# Patient Record
Sex: Male | Born: 1950 | Race: Black or African American | Hispanic: No | Smoking: Current some day smoker
Health system: Southern US, Community
[De-identification: ages and names within clinical notes are randomized; demographics above are authoritative.]

## PROBLEM LIST (undated history)

## (undated) DIAGNOSIS — I10 Essential (primary) hypertension: Secondary | ICD-10-CM

## (undated) DIAGNOSIS — C801 Malignant (primary) neoplasm, unspecified: Secondary | ICD-10-CM

## (undated) DIAGNOSIS — B1920 Unspecified viral hepatitis C without hepatic coma: Secondary | ICD-10-CM

## (undated) HISTORY — PX: COLON SURGERY: SHX602

## (undated) HISTORY — PX: APPENDECTOMY: SHX54

## (undated) HISTORY — PX: ABDOMINAL SURGERY: SHX537

---

## 2005-05-30 ENCOUNTER — Ambulatory Visit: Payer: Self-pay | Admitting: Gastroenterology

## 2010-05-02 ENCOUNTER — Ambulatory Visit: Payer: Self-pay | Admitting: Oncology

## 2010-05-03 ENCOUNTER — Inpatient Hospital Stay: Payer: Self-pay | Admitting: General Surgery

## 2010-05-31 ENCOUNTER — Ambulatory Visit: Payer: Self-pay | Admitting: Oncology

## 2010-07-02 ENCOUNTER — Ambulatory Visit: Payer: Self-pay | Admitting: Oncology

## 2010-07-04 ENCOUNTER — Ambulatory Visit: Payer: Self-pay | Admitting: Oncology

## 2010-08-02 ENCOUNTER — Ambulatory Visit: Payer: Self-pay | Admitting: Oncology

## 2010-09-01 ENCOUNTER — Ambulatory Visit: Payer: Self-pay | Admitting: Oncology

## 2010-10-02 ENCOUNTER — Ambulatory Visit: Payer: Self-pay | Admitting: Oncology

## 2010-10-02 ENCOUNTER — Ambulatory Visit: Payer: Self-pay

## 2010-11-01 ENCOUNTER — Ambulatory Visit: Payer: Self-pay | Admitting: Oncology

## 2010-12-02 ENCOUNTER — Ambulatory Visit: Payer: Self-pay | Admitting: Oncology

## 2011-01-02 ENCOUNTER — Ambulatory Visit: Payer: Self-pay | Admitting: Oncology

## 2011-01-31 ENCOUNTER — Ambulatory Visit: Payer: Self-pay | Admitting: Oncology

## 2011-03-03 ENCOUNTER — Ambulatory Visit: Payer: Self-pay | Admitting: Oncology

## 2011-04-02 ENCOUNTER — Ambulatory Visit: Payer: Self-pay | Admitting: Oncology

## 2011-05-03 ENCOUNTER — Ambulatory Visit: Payer: Self-pay | Admitting: Oncology

## 2011-05-06 ENCOUNTER — Ambulatory Visit: Payer: Self-pay | Admitting: Oncology

## 2011-05-13 ENCOUNTER — Ambulatory Visit: Payer: Self-pay | Admitting: Oncology

## 2011-06-02 ENCOUNTER — Ambulatory Visit: Payer: Self-pay | Admitting: Oncology

## 2011-07-06 ENCOUNTER — Emergency Department: Payer: Self-pay | Admitting: Emergency Medicine

## 2011-07-07 ENCOUNTER — Ambulatory Visit: Payer: Self-pay | Admitting: Oncology

## 2011-08-14 ENCOUNTER — Ambulatory Visit: Payer: Self-pay | Admitting: Oncology

## 2011-09-02 ENCOUNTER — Ambulatory Visit: Payer: Self-pay | Admitting: Oncology

## 2011-10-03 ENCOUNTER — Ambulatory Visit: Payer: Self-pay | Admitting: Oncology

## 2011-11-02 ENCOUNTER — Ambulatory Visit: Payer: Self-pay | Admitting: Oncology

## 2011-12-03 ENCOUNTER — Ambulatory Visit: Payer: Self-pay | Admitting: Oncology

## 2012-01-03 ENCOUNTER — Ambulatory Visit: Payer: Self-pay | Admitting: Oncology

## 2012-02-03 ENCOUNTER — Ambulatory Visit: Payer: Self-pay | Admitting: Oncology

## 2012-03-02 ENCOUNTER — Ambulatory Visit: Payer: Self-pay | Admitting: Oncology

## 2012-03-24 ENCOUNTER — Ambulatory Visit: Payer: Self-pay | Admitting: Oncology

## 2012-03-26 ENCOUNTER — Ambulatory Visit: Payer: Self-pay | Admitting: Oncology

## 2012-04-01 ENCOUNTER — Ambulatory Visit: Payer: Self-pay | Admitting: Oncology

## 2012-04-30 DIAGNOSIS — M549 Dorsalgia, unspecified: Secondary | ICD-10-CM | POA: Insufficient documentation

## 2012-04-30 DIAGNOSIS — C19 Malignant neoplasm of rectosigmoid junction: Secondary | ICD-10-CM | POA: Insufficient documentation

## 2012-05-02 ENCOUNTER — Ambulatory Visit: Payer: Self-pay | Admitting: Oncology

## 2012-06-01 ENCOUNTER — Ambulatory Visit: Payer: Self-pay | Admitting: Oncology

## 2012-06-18 LAB — CBC CANCER CENTER
Basophil #: 0 x10 3/mm (ref 0.0–0.1)
Eosinophil #: 0.1 x10 3/mm (ref 0.0–0.7)
HGB: 13.5 g/dL (ref 13.0–18.0)
Lymphocyte %: 37.6 %
MCH: 32.3 pg (ref 26.0–34.0)
MCV: 100 fL (ref 80–100)
Monocyte %: 8.8 %
Neutrophil %: 51.4 %
RBC: 4.18 10*6/uL — ABNORMAL LOW (ref 4.40–5.90)
RDW: 12.1 % (ref 11.5–14.5)
WBC: 5.8 x10 3/mm (ref 3.8–10.6)

## 2012-07-02 ENCOUNTER — Ambulatory Visit: Payer: Self-pay | Admitting: Oncology

## 2012-12-02 ENCOUNTER — Ambulatory Visit: Payer: Self-pay | Admitting: Oncology

## 2012-12-17 LAB — CBC CANCER CENTER
Basophil #: 0.1 x10 3/mm (ref 0.0–0.1)
Basophil %: 0.8 %
Eosinophil %: 1.4 %
HCT: 43.1 % (ref 40.0–52.0)
Lymphocyte #: 2.4 x10 3/mm (ref 1.0–3.6)
Lymphocyte %: 33.2 %
MCH: 33 pg (ref 26.0–34.0)
MCV: 97 fL (ref 80–100)
Monocyte #: 0.6 x10 3/mm (ref 0.2–1.0)
Monocyte %: 8.3 %
Neutrophil #: 4 x10 3/mm (ref 1.4–6.5)
Neutrophil %: 56.3 %
Platelet: 73 x10 3/mm — ABNORMAL LOW (ref 150–440)
RBC: 4.43 10*6/uL (ref 4.40–5.90)
RDW: 12.7 % (ref 11.5–14.5)
WBC: 7.1 x10 3/mm (ref 3.8–10.6)

## 2013-01-02 ENCOUNTER — Ambulatory Visit: Payer: Self-pay | Admitting: Oncology

## 2013-03-26 ENCOUNTER — Ambulatory Visit: Payer: Self-pay | Admitting: Oncology

## 2013-04-06 ENCOUNTER — Ambulatory Visit: Payer: Self-pay | Admitting: Oncology

## 2013-04-08 ENCOUNTER — Ambulatory Visit: Payer: Self-pay | Admitting: Oncology

## 2013-05-02 ENCOUNTER — Ambulatory Visit: Payer: Self-pay | Admitting: Oncology

## 2013-06-02 ENCOUNTER — Ambulatory Visit: Payer: Self-pay | Admitting: Family Medicine

## 2013-07-02 ENCOUNTER — Ambulatory Visit: Payer: Self-pay | Admitting: Oncology

## 2013-08-30 ENCOUNTER — Ambulatory Visit: Payer: Self-pay | Admitting: Gastroenterology

## 2013-08-30 LAB — CBC WITH DIFFERENTIAL/PLATELET
Basophil %: 1.1 %
Eosinophil #: 0.1 10*3/uL (ref 0.0–0.7)
Eosinophil %: 2.4 %
Lymphocyte #: 2.1 10*3/uL (ref 1.0–3.6)
MCH: 33.5 pg (ref 26.0–34.0)
MCHC: 33.4 g/dL (ref 32.0–36.0)
MCV: 100 fL (ref 80–100)
Monocyte #: 0.4 x10 3/mm (ref 0.2–1.0)
Monocyte %: 8.6 %
Neutrophil #: 2 10*3/uL (ref 1.4–6.5)
Platelet: 62 10*3/uL — ABNORMAL LOW (ref 150–440)
WBC: 4.6 10*3/uL (ref 3.8–10.6)

## 2013-10-08 ENCOUNTER — Ambulatory Visit: Payer: Self-pay | Admitting: Oncology

## 2013-10-08 LAB — CBC CANCER CENTER
Basophil #: 0.1 x10 3/mm (ref 0.0–0.1)
Eosinophil %: 1.4 %
HGB: 14.6 g/dL (ref 13.0–18.0)
Lymphocyte #: 2.4 x10 3/mm (ref 1.0–3.6)
Lymphocyte %: 37 %
MCHC: 32.9 g/dL (ref 32.0–36.0)
Monocyte #: 0.7 x10 3/mm (ref 0.2–1.0)
Monocyte %: 10.5 %
Neutrophil %: 50.3 %
Platelet: 84 x10 3/mm — ABNORMAL LOW (ref 150–440)
RBC: 4.44 10*6/uL (ref 4.40–5.90)
RDW: 13 % (ref 11.5–14.5)

## 2013-10-08 LAB — BASIC METABOLIC PANEL
BUN: 7 mg/dL (ref 7–18)
Calcium, Total: 9.3 mg/dL (ref 8.5–10.1)
Chloride: 103 mmol/L (ref 98–107)
Co2: 31 mmol/L (ref 21–32)
Creatinine: 1.04 mg/dL (ref 0.60–1.30)
EGFR (African American): >60
Glucose: 96 mg/dL (ref 65–99)
Osmolality: 279 (ref 275–301)
Potassium: 4 mmol/L (ref 3.5–5.1)
Sodium: 141 mmol/L (ref 136–145)

## 2013-11-01 ENCOUNTER — Ambulatory Visit: Payer: Self-pay | Admitting: Oncology

## 2014-04-21 ENCOUNTER — Ambulatory Visit: Payer: Self-pay | Admitting: Oncology

## 2015-10-03 ENCOUNTER — Other Ambulatory Visit: Payer: Self-pay | Admitting: Nurse Practitioner

## 2015-10-03 DIAGNOSIS — B182 Chronic viral hepatitis C: Secondary | ICD-10-CM

## 2015-10-04 ENCOUNTER — Other Ambulatory Visit: Payer: Self-pay | Admitting: Nurse Practitioner

## 2015-10-04 DIAGNOSIS — B182 Chronic viral hepatitis C: Secondary | ICD-10-CM

## 2015-10-11 ENCOUNTER — Ambulatory Visit
Admission: RE | Admit: 2015-10-11 | Discharge: 2015-10-11 | Disposition: A | Discharge status: Home / Self Care | Payer: Medicare Other | Source: Ambulatory Visit | Attending: Nurse Practitioner | Admitting: Nurse Practitioner

## 2015-10-11 DIAGNOSIS — B182 Chronic viral hepatitis C: Secondary | ICD-10-CM | POA: Insufficient documentation

## 2015-10-20 ENCOUNTER — Other Ambulatory Visit: Payer: Self-pay | Admitting: Nurse Practitioner

## 2015-10-20 DIAGNOSIS — R160 Hepatomegaly, not elsewhere classified: Secondary | ICD-10-CM

## 2015-11-10 ENCOUNTER — Ambulatory Visit
Admission: RE | Admit: 2015-11-10 | Discharge: 2015-11-10 | Disposition: A | Discharge status: Home / Self Care | Payer: Medicare Other | Source: Ambulatory Visit | Attending: Nurse Practitioner | Admitting: Nurse Practitioner

## 2015-11-10 ENCOUNTER — Other Ambulatory Visit: Payer: Self-pay | Admitting: Nurse Practitioner

## 2015-11-10 DIAGNOSIS — N281 Cyst of kidney, acquired: Secondary | ICD-10-CM | POA: Insufficient documentation

## 2015-11-10 DIAGNOSIS — R160 Hepatomegaly, not elsewhere classified: Secondary | ICD-10-CM | POA: Insufficient documentation

## 2015-11-10 MED ORDER — GADOBENATE DIMEGLUMINE 529 MG/ML IV SOLN
15.0000 mL | Freq: Once | INTRAVENOUS | Status: AC | PRN
  Administered 2015-11-10: 14 mL via INTRAVENOUS

## 2016-01-12 ENCOUNTER — Other Ambulatory Visit: Payer: Self-pay | Admitting: *Deleted

## 2016-05-02 ENCOUNTER — Encounter: Payer: Self-pay | Admitting: Emergency Medicine

## 2016-05-02 ENCOUNTER — Emergency Department
Admission: EM | Admit: 2016-05-02 | Discharge: 2016-05-02 | Disposition: A | Discharge status: Home / Self Care | Payer: Medicare Other | Attending: Emergency Medicine | Admitting: Emergency Medicine

## 2016-05-02 DIAGNOSIS — I10 Essential (primary) hypertension: Secondary | ICD-10-CM | POA: Insufficient documentation

## 2016-05-02 DIAGNOSIS — Z859 Personal history of malignant neoplasm, unspecified: Secondary | ICD-10-CM | POA: Diagnosis not present

## 2016-05-02 DIAGNOSIS — F17210 Nicotine dependence, cigarettes, uncomplicated: Secondary | ICD-10-CM | POA: Diagnosis not present

## 2016-05-02 HISTORY — DX: Essential (primary) hypertension: I10

## 2016-05-02 HISTORY — DX: Unspecified viral hepatitis C without hepatic coma: B19.20

## 2016-05-02 HISTORY — DX: Malignant (primary) neoplasm, unspecified: C80.1

## 2016-05-02 NOTE — Discharge Instructions (Signed)

## 2016-05-02 NOTE — ED Notes (Signed)
Patient here for elevated blood pressure. States pressure is always on the high side but he was in drug store and took it and it was higher than normal. States he has had some medication changes with his psych meds and that might be the problem. Was in drug store to refill his effexor medication. Has been off of it for a few days.

## 2016-05-02 NOTE — ED Notes (Signed)
Reviewed d/c instructions, follow-up care with pt. Pt verbalized understanding 

## 2016-05-02 NOTE — ED Provider Notes (Signed)
[facility] Emergency Department Provider Note  ____________________________________________  Time seen: Approximately 6:45 PM  I have reviewed the triage vital signs and the nursing notes.   HISTORY  Chief Complaint Hypertension    HPI Nathan Brown is a 65 y.o. male who has a primary care doctor as well as going to the [place] for care and is currently transitioning to only receiving care at the [place].  He presents today with no symptoms but concerned about his blood pressure being elevated.  He states that he takes lisinopril and metoprolol and that the doses of the medications have not changed and probably 10 years.  He believes that his cardiologist was the one that originally prescribed them.  He denies headache, neck pain, fever/chills, chest pain, shortness of breath, abdominal pain, nausea, vomiting, diarrhea.  He has been urinating normally recently and has no pain or discomfort or increased frequency nor hesitation.  He was at the drugstore for a different reason today and decided to check his blood pressure and was concerned when it was elevated around [id number] systolic, so he decided to come to the emergency department.He did state that his blood pressure has been as high in the past.  He describes the elevated blood pressure is severe although asymptomatic, nothing seems to make it better or worse.  Onset of the hypertension is unknown.    Past Medical History  Diagnosis Date  . Cancer ([place])   . Hypertension   . Hepatitis C     There are no active problems to display for this patient.   Past Surgical History  Procedure Laterality Date  . Abdominal surgery    . Colon surgery    . Appendectomy      No current outpatient prescriptions on file.  Allergies Review of patient's allergies indicates no known allergies.  No family history on file.  Social History Social History  Substance Use Topics  . Smoking status: Current Some Day Smoker   Types: Cigarettes  . Smokeless tobacco: None  . Alcohol Use: No     Comment: occas    Review of Systems Constitutional: No fever/chills Eyes: No visual changes. ENT: No sore throat. Cardiovascular: Denies chest pain. Respiratory: Denies shortness of breath. Gastrointestinal: No abdominal pain.  No nausea, no vomiting.  No diarrhea.  No constipation. Genitourinary: Negative for dysuria. Musculoskeletal: Negative for back pain. Skin: Negative for rash. Neurological: Negative for headaches, focal weakness or numbness.  10-point ROS otherwise negative.  ____________________________________________   PHYSICAL EXAM:  VITAL SIGNS: ED Triage Vitals  Enc Vitals Group     BP 05/02/16 1817 205/90 mmHg     Pulse Rate 05/02/16 1817 60     Resp 05/02/16 1817 18     Temp 05/02/16 1817 98.9 F (37.2 C)     Temp Source 05/02/16 1817 Oral     SpO2 05/02/16 1817 98 %     Weight 05/02/16 1817 150 lb (68.04 kg)     Height 05/02/16 1817 5\' 6"  (1.676 m)     Head Cir --      Peak Flow --      Pain Score --      Pain Loc --      Pain Edu? --      Excl. in [place]? --     Constitutional: Alert and oriented. Well appearing and in no acute distress. Eyes: Conjunctivae are normal. PERRL. EOMI. Head: Atraumatic. Nose: No congestion/rhinnorhea. Mouth/Throat: Mucous membranes are moist.  Oropharynx non-erythematous. Neck: No stridor.  No meningeal signs.   Cardiovascular: Normal rate, regular rhythm. Good peripheral circulation. Grossly normal heart sounds.   Respiratory: Normal respiratory effort.  No retractions. Lungs CTAB. Gastrointestinal: Soft and nontender. No distention.  Musculoskeletal: No lower extremity tenderness nor edema. No gross deformities of extremities. Neurologic:  Normal speech and language. No gross focal neurologic deficits are appreciated.  Skin:  Skin is warm, dry and intact. No rash noted. Psychiatric: Mood and affect are normal. Speech and behavior are  normal.  ____________________________________________   LABS (all labs ordered are listed, but only abnormal results are displayed)  Labs Reviewed - No data to display ____________________________________________  EKG  None ____________________________________________  RADIOLOGY   No results found.  ____________________________________________   PROCEDURES  Procedure(s) performed: None  Critical Care performed: No ____________________________________________   INITIAL IMPRESSION / ASSESSMENT AND PLAN / ED COURSE  Pertinent labs & imaging results that were available during my care of the patient were reviewed by me and considered in my medical decision making (see chart for details).  I had my usual and customary asymptomatic hypertension discussion with the patient.  I offered to check a metabolic panel to make sure that his kidney function was normal, and I even offered to start him on another medication such as amlodipine given that this is likely chronic hypertension.  However, he seemed reassured after talking to me and does not want to do any blood work today and does not want to start on any medications.  He said that he can follow-up with his doctor at the [place] quickly and easily and would prefer to do that.  I think that is appropriate, and I gave him my usual and customary return precautions.  He was so ready to leave that we almost did not get him his paperwork before he walked out the door.   ____________________________________________  FINAL CLINICAL IMPRESSION(S) / ED DIAGNOSES  Final diagnoses:  Essential hypertension     MEDICATIONS GIVEN DURING THIS VISIT:  Medications - No data to display   NEW OUTPATIENT MEDICATIONS STARTED DURING THIS VISIT:  There are no discharge medications for this patient.     Note:  This document was prepared using Dragon voice recognition software and may include unintentional dictation errors.   Hinda Kehr,  MD 05/02/16 [phone]

## 2016-05-02 NOTE — ED Notes (Signed)
Pt states he took his bp in drug store, it was high, so he came here. Pt states he took his BP med this am. 205/90 in triage. Denies any pain.

## 2016-07-02 IMAGING — US US ABDOMEN LIMITED
2 series · 13 of 25 positions shown · non-contrast
Comparison: None.

CLINICAL DATA: Chronic hepatitis-C.  Without coma.

EXAM:
US ABDOMEN LIMITED - RIGHT UPPER QUADRANT
ULTRASOUND HEPATIC ELASTOGRAPHY
TECHNIQUE: Limited right upper quadrant abdominal ultrasound was performed. In
addition, ultrasound elastography evaluation of the liver was
performed. A region of interest was placed in the right lobe of the
liver. Following application of a compressive sonographic pulse,
shear waves were detected in the adjacent hepatic tissue and the
shear wave velocity was calculated. Multiple assessments were
performed at the selected site. Median shear wave velocity is
correlated to a Metavir fibrosis score.

[Series 1: us abdomen limited · 0.18mm/px · 12 of 73 slices shown (1 of 2)]
[im 1/73]
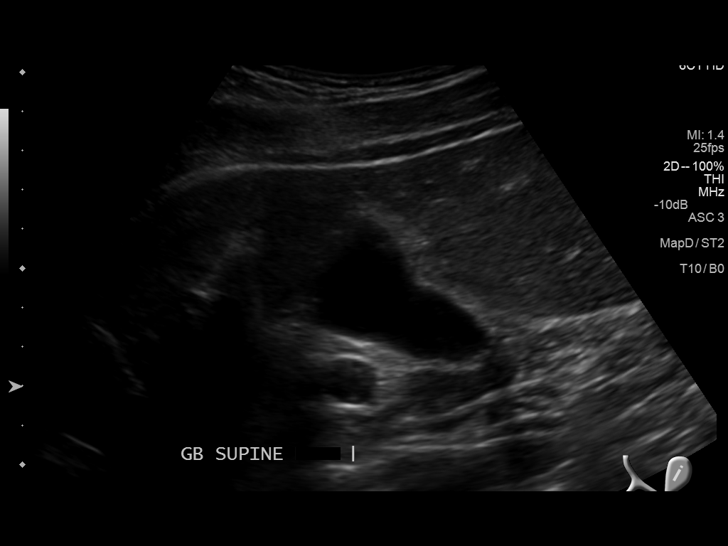
[im 7/73]
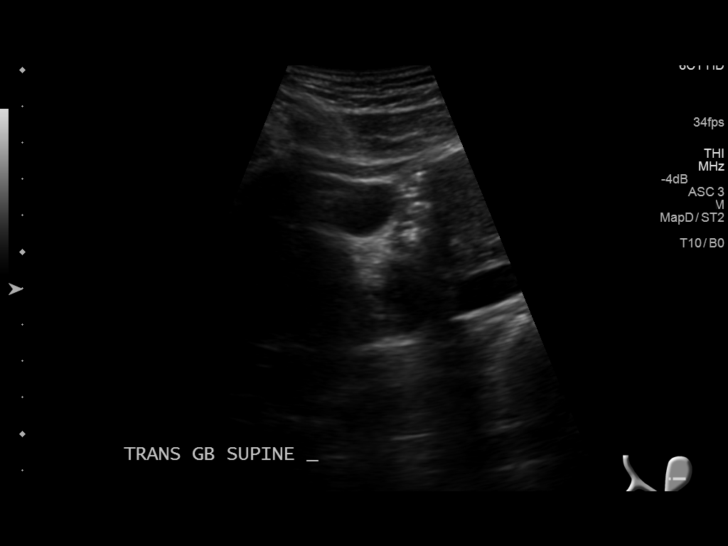
[im 13/73]
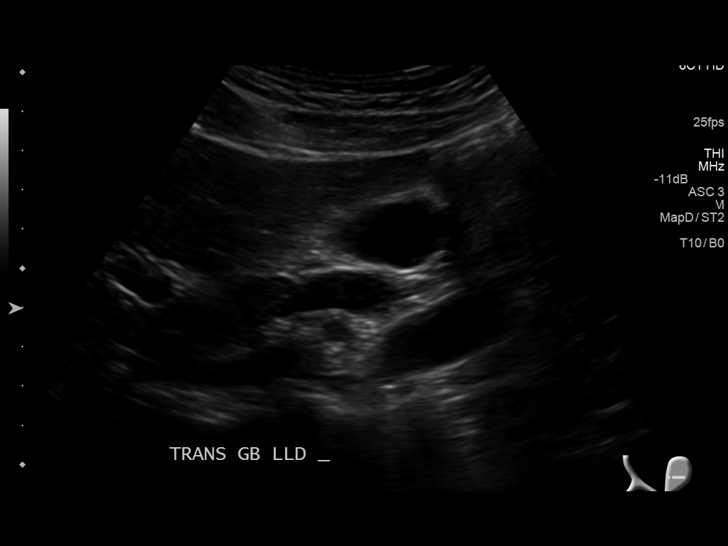
[im 19/73]
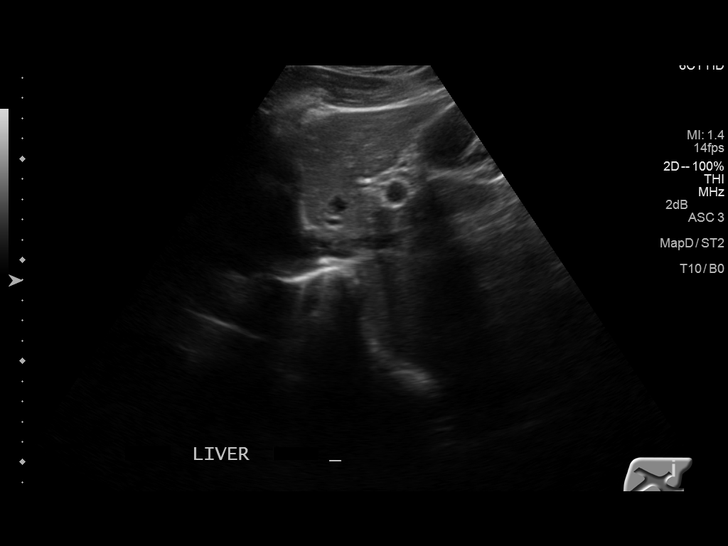
[im 26/73]
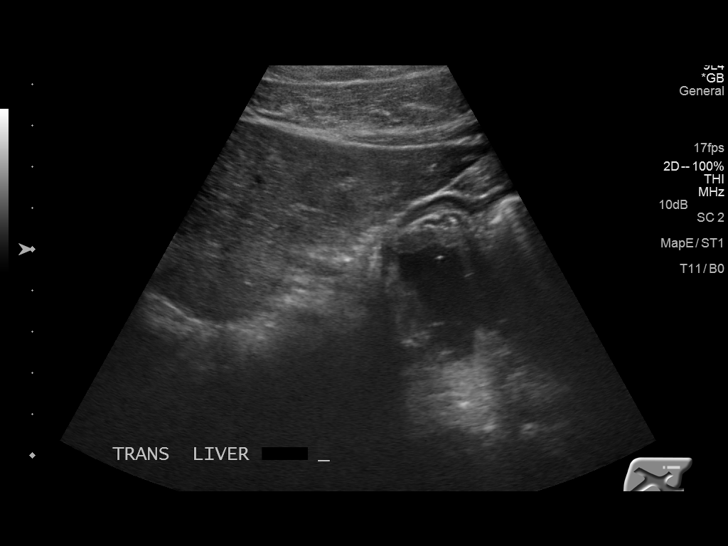
[im 32/73]
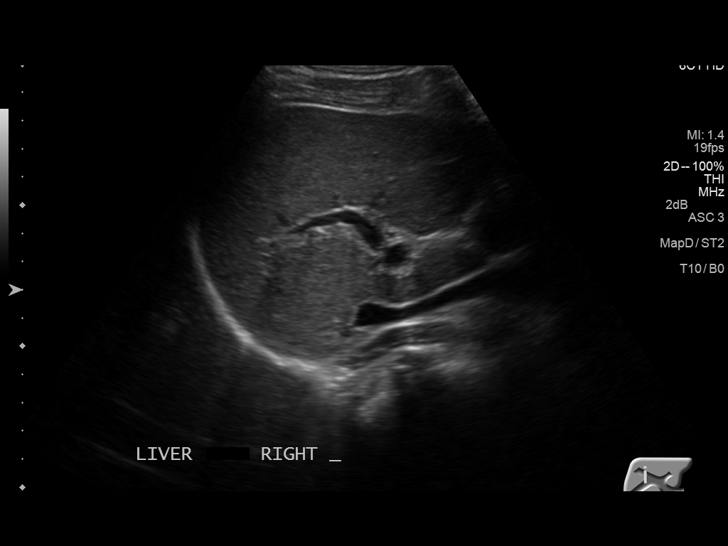
[im 38/73]
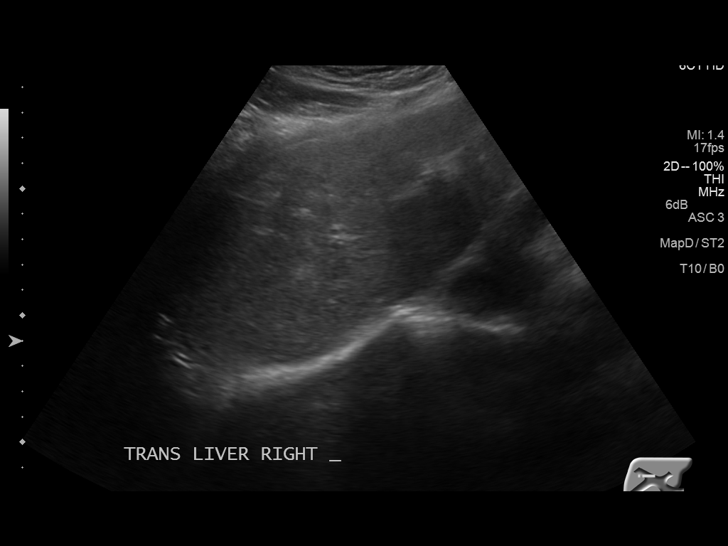
[im 44/73]
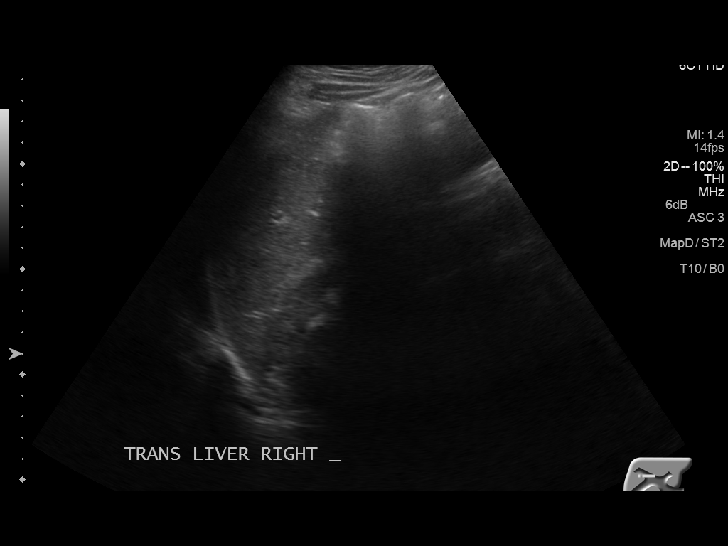
[im 51/73]
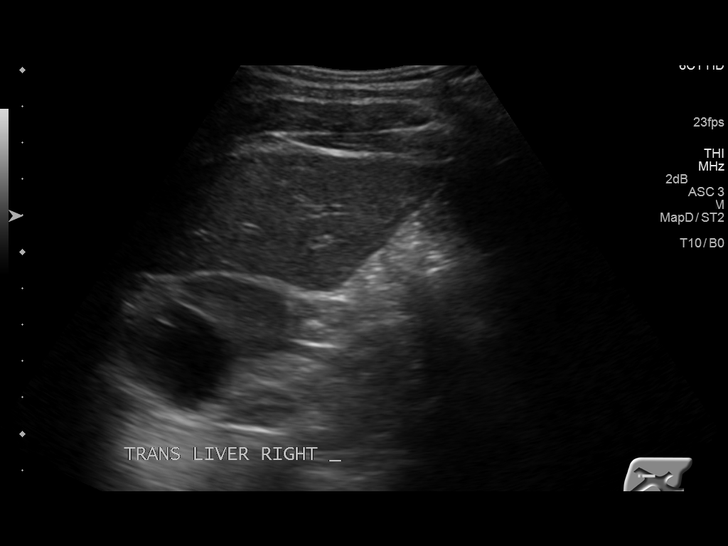
[im 57/73]
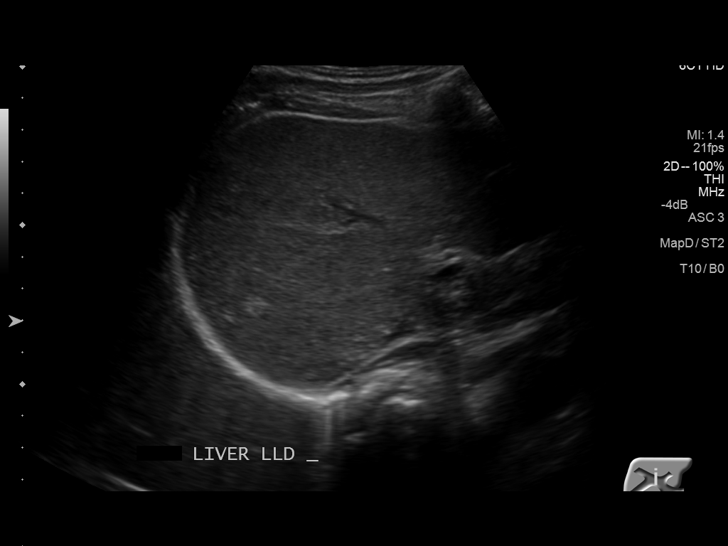
[im 63/73]
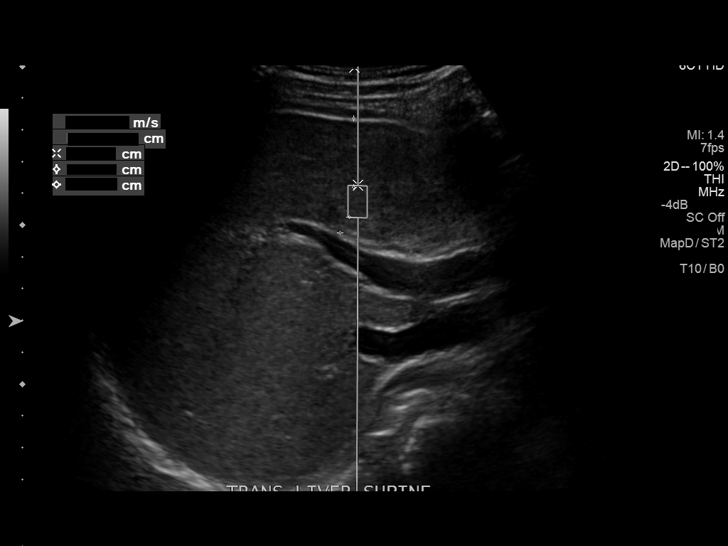
[im 69/73]
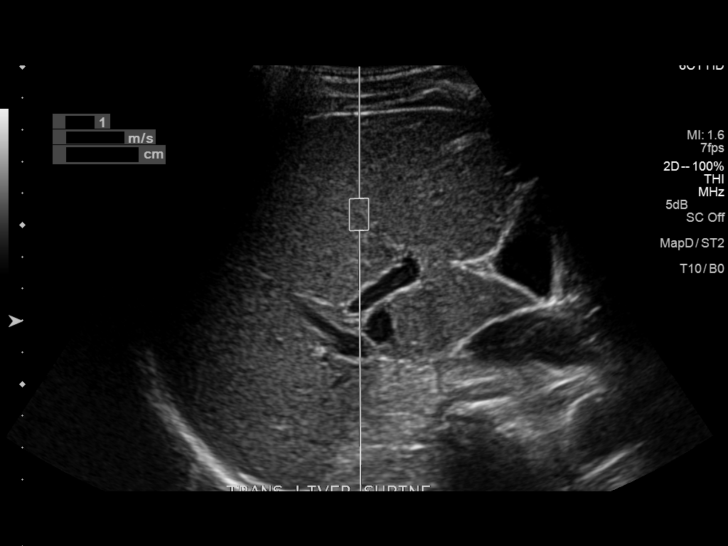

[Series 3: us abdomen limited · 0.20mm/px · 1 of 3 slices shown (2 of 2)]
[im 1/3]
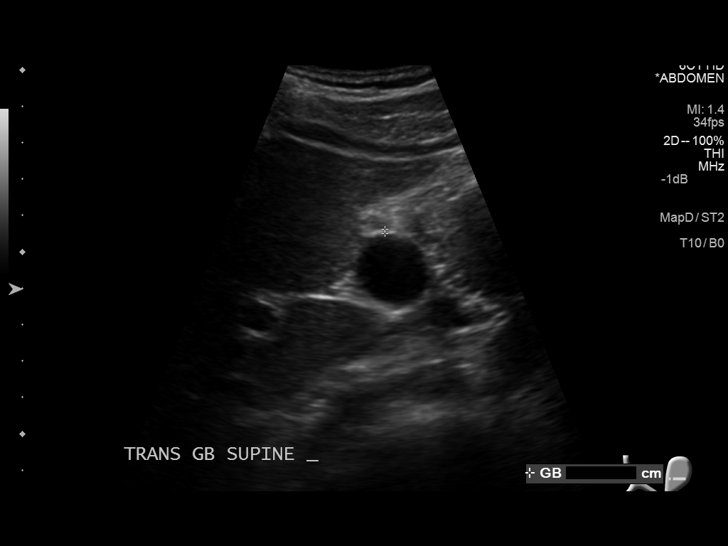

[13 of 25 positions shown; findings below may reference images not displayed]

FINDINGS: ULTRASOUND ABDOMEN LIMITED RIGHT UPPER QUADRANT

Gallbladder: No gallstones or wall thickening visualized. No
sonographic Murphy sign noted.

Common bile duct: Diameter: 1.7 mm

Liver: Hyperechoic mass inferior to the right lobe of liver measures
9 x 6 x 9 mm. Indeterminate.

ULTRASOUND HEPATIC ELASTOGRAPHY

Device: Siemens Helix VQ

Transducer: 6 C1

Patient position: Supine

Number of measurements:  10

Hepatic Segment:  8

Median velocity:   1.85  m/sec

IQR:

IQR/Median velocity ratio:

Corresponding Metavir fibrosis score:  F2 and F3

Risk of fibrosis: Moderate

Limitations of exam: None

Pertinent findings noted on other imaging exams:  None

Please note that abnormal shear wave velocities may also be
identified in clinical settings other than with hepatic fibrosis,
such as: acute hepatitis, elevated right heart and central venous
pressures including use of beta blockers, Fab disease
([place]), infiltrative processes such as
mastocytosis/amyloidosis/infiltrative tumor, extrahepatic
cholestasis, in the post-prandial state, and liver transplantation.
Correlation with patient history, laboratory data, and clinical
condition recommended.
IMPRESSION: 1. There is an indeterminate hyperechoic structure within the right
lobe of liver. Although this may represent a benign liver
abnormality such as hemangioma, in a patient that may be at
increased risk for primary liver malignancy further imaging is
recommended. The study of choice would be a contrast enhanced liver
protocol MRI.

Median hepatic shear wave velocity is calculated at 1.85 m/sec.
Corresponding Metavir fibrosis score is F2 and F3.
Risk of fibrosis is moderate.

Follow-up:  Additional testing appropriate.

## 2017-06-22 ENCOUNTER — Emergency Department
Admission: EM | Admit: 2017-06-22 | Discharge: 2017-06-22 | Disposition: A | Discharge status: Home / Self Care | Payer: Medicare Other | Attending: Emergency Medicine | Admitting: Emergency Medicine

## 2017-06-22 ENCOUNTER — Emergency Department: Payer: Medicare Other

## 2017-06-22 ENCOUNTER — Other Ambulatory Visit: Payer: Self-pay

## 2017-06-22 ENCOUNTER — Encounter: Payer: Self-pay | Admitting: Radiology

## 2017-06-22 DIAGNOSIS — R110 Nausea: Secondary | ICD-10-CM

## 2017-06-22 DIAGNOSIS — I10 Essential (primary) hypertension: Secondary | ICD-10-CM | POA: Diagnosis not present

## 2017-06-22 DIAGNOSIS — B1710 Acute hepatitis C without hepatic coma: Secondary | ICD-10-CM | POA: Diagnosis not present

## 2017-06-22 DIAGNOSIS — R109 Unspecified abdominal pain: Secondary | ICD-10-CM | POA: Diagnosis present

## 2017-06-22 DIAGNOSIS — R197 Diarrhea, unspecified: Secondary | ICD-10-CM | POA: Diagnosis not present

## 2017-06-22 DIAGNOSIS — F17210 Nicotine dependence, cigarettes, uncomplicated: Secondary | ICD-10-CM | POA: Diagnosis not present

## 2017-06-22 DIAGNOSIS — K859 Acute pancreatitis without necrosis or infection, unspecified: Secondary | ICD-10-CM

## 2017-06-22 LAB — URINALYSIS, COMPLETE (UACMP) WITH MICROSCOPIC
BACTERIA UA: NONE SEEN
Bilirubin Urine: NEGATIVE
Glucose, UA: NEGATIVE mg/dL
Hgb urine dipstick: NEGATIVE
Ketones, ur: 5 mg/dL — AB
Leukocytes, UA: NEGATIVE
NITRITE: NEGATIVE
PH: 6 (ref 5.0–8.0)
Protein, ur: NEGATIVE mg/dL
Specific Gravity, Urine: 1.006 (ref 1.005–1.030)

## 2017-06-22 LAB — COMPREHENSIVE METABOLIC PANEL
ALBUMIN: 4.5 g/dL (ref 3.5–5.0)
ALT: 103 U/L — AB (ref 17–63)
AST: 113 U/L — AB (ref 15–41)
Alkaline Phosphatase: 51 U/L (ref 38–126)
Anion gap: 9 (ref 5–15)
BILIRUBIN TOTAL: 1.3 mg/dL — AB (ref 0.3–1.2)
CO2: 26 mmol/L (ref 22–32)
CREATININE: 0.92 mg/dL (ref 0.61–1.24)
Calcium: 9.2 mg/dL (ref 8.9–10.3)
Chloride: 100 mmol/L — ABNORMAL LOW (ref 101–111)
GFR calc Af Amer: >60 mL/min (ref >60)
GFR calc non Af Amer: >60 mL/min (ref >60)
GLUCOSE: 127 mg/dL — AB (ref 65–99)
POTASSIUM: 3.1 mmol/L — AB (ref 3.5–5.1)
Sodium: 135 mmol/L (ref 135–145)
TOTAL PROTEIN: 7.7 g/dL (ref 6.5–8.1)

## 2017-06-22 LAB — CBC
HEMATOCRIT: 40.6 % (ref 40.0–52.0)
Hemoglobin: 13.7 g/dL (ref 13.0–18.0)
MCH: 33.1 pg (ref 26.0–34.0)
MCHC: 33.7 g/dL (ref 32.0–36.0)
MCV: 98.1 fL (ref 80.0–100.0)
Platelets: 82 10*3/uL — ABNORMAL LOW (ref 150–440)
RBC: 4.14 MIL/uL — ABNORMAL LOW (ref 4.40–5.90)
RDW: 13.2 % (ref 11.5–14.5)
WBC: 7.4 10*3/uL (ref 3.8–10.6)

## 2017-06-22 LAB — TROPONIN I: Troponin I: <0.03 ng/mL (ref <0.03)

## 2017-06-22 LAB — LIPASE, BLOOD: LIPASE: 81 U/L — AB (ref 11–51)

## 2017-06-22 MED ORDER — DICYCLOMINE HCL 20 MG PO TABS
ORAL_TABLET | ORAL | Status: AC
  Filled 2017-06-22: qty 1

## 2017-06-22 MED ORDER — MORPHINE SULFATE (PF) 4 MG/ML IV SOLN
4.0000 mg | Freq: Once | INTRAVENOUS | Status: AC
  Administered 2017-06-22: 4 mg via INTRAVENOUS
  Filled 2017-06-22: qty 1

## 2017-06-22 MED ORDER — ONDANSETRON HCL 4 MG PO TABS: 4.0000 mg | ORAL_TABLET | Freq: Every day | ORAL | 0 refills | Status: AC | PRN

## 2017-06-22 MED ORDER — DICYCLOMINE HCL 20 MG PO TABS
20.0000 mg | ORAL_TABLET | Freq: Once | ORAL | Status: AC
  Administered 2017-06-22: 20 mg via ORAL

## 2017-06-22 MED ORDER — IOPAMIDOL (ISOVUE-300) INJECTION 61%
75.0000 mL | Freq: Once | INTRAVENOUS | Status: AC | PRN
  Administered 2017-06-22: 75 mL via INTRAVENOUS

## 2017-06-22 MED ORDER — ONDANSETRON 4 MG PO TBDP
ORAL_TABLET | ORAL | Status: AC
  Filled 2017-06-22: qty 1

## 2017-06-22 MED ORDER — ONDANSETRON HCL 4 MG/2ML IJ SOLN
4.0000 mg | Freq: Once | INTRAMUSCULAR | Status: AC
  Administered 2017-06-22: 4 mg via INTRAVENOUS
  Filled 2017-06-22: qty 2

## 2017-06-22 MED ORDER — SODIUM CHLORIDE 0.9 % IV BOLUS (SEPSIS)
1000.0000 mL | Freq: Once | INTRAVENOUS | Status: AC
  Administered 2017-06-22: 1000 mL via INTRAVENOUS

## 2017-06-22 MED ORDER — DICYCLOMINE HCL 10 MG PO CAPS: 20.0000 mg | ORAL_CAPSULE | Freq: Once | ORAL | Status: DC

## 2017-06-22 MED ORDER — DICYCLOMINE HCL 20 MG PO TABS: 20.0000 mg | ORAL_TABLET | Freq: Three times a day (TID) | ORAL | 0 refills | Status: AC | PRN

## 2017-06-22 MED ORDER — ONDANSETRON 4 MG PO TBDP
4.0000 mg | ORAL_TABLET | Freq: Once | ORAL | Status: AC | PRN
  Administered 2017-06-22: 4 mg via ORAL

## 2017-06-22 MED ORDER — IOPAMIDOL (ISOVUE-300) INJECTION 61%
30.0000 mL | Freq: Once | INTRAVENOUS | Status: AC | PRN
  Administered 2017-06-22: 30 mL via ORAL

## 2017-06-22 NOTE — ED Triage Notes (Signed)
Pt states generalized abd pain with nausea that began this am after eating breakfast. Pt has a history of pancreatic and colon cancer. Pt does not appear to be in any distress in triage, however is hypertensive. Pt with history of htn.

## 2017-06-22 NOTE — ED Provider Notes (Addendum)
[facility] Emergency Department Provider Note  ____________________________________________   First MD Initiated Contact with Patient 06/22/17 2054     (approximate)  I have reviewed the triage vital signs and the nursing notes.   HISTORY  Chief Complaint Abdominal Pain   HPI Nathan Brown is a 66 y.o. male with a history of pancreatic cancer status post Whipple procedure this past April at the [facility] who is presenting to the emergency department with lower abdominal pain as well as nausea and diarrhea. He denies any vomiting or blood in his stool. Says the pain is a 10 out of 10 at this time and cramping. Says that he has only had diarrhea since 9 denies any recent antibiotics. Is unable to give the amount of episodes of diarrhea that he has had. Denies any radiation of the pain. Says that he is not on any chemotherapy or radiation and had complete resection of his cancer.   Past Medical History:  Diagnosis Date  . Cancer ([place])   . Hepatitis C   . Hypertension     There are no active problems to display for this patient.   Past Surgical History:  Procedure Laterality Date  . ABDOMINAL SURGERY    . APPENDECTOMY    . COLON SURGERY      Prior to Admission medications   Not on File    Allergies Patient has no known allergies.  No family history on file.  Social History Social History  Substance Use Topics  . Smoking status: Current Some Day Smoker    Types: Cigarettes  . Smokeless tobacco: Not on file  . Alcohol use No     Comment: occas    Review of Systems  Constitutional: No fever/chills Eyes: No visual changes. ENT: No sore throat. Cardiovascular: Denies chest pain. Respiratory: Denies shortness of breath. Gastrointestinal:  no vomiting.   No constipation. Genitourinary: Negative for dysuria. Musculoskeletal: Negative for back pain. Skin: Negative for rash. Neurological: Negative for headaches, focal weakness or  numbness.   ____________________________________________   PHYSICAL EXAM:  VITAL SIGNS: ED Triage Vitals [06/22/17 1923]  Enc Vitals Group     BP (!) 185/103     Pulse Rate (!) 54     Resp 16     Temp 98.5 F (36.9 C)     Temp Source Oral     SpO2 100 %     Weight 138 lb (62.6 kg)     Height 5\' 6"  (1.676 m)     Head Circumference      Peak Flow      Pain Score 10     Pain Loc      Pain Edu?      Excl. in [place]?     Constitutional: Alert and oriented. Well appearing and in no acute distress. Eyes: Conjunctivae are normal.  Head: Atraumatic. Nose: No congestion/rhinnorhea. Mouth/Throat: Mucous membranes are moist.  Neck: No stridor.   Cardiovascular: Normal rate, regular rhythm. Grossly normal heart sounds.   Respiratory: Normal respiratory effort.  No retractions. Lungs CTAB. Gastrointestinal: Soft With mild tenderness to the lower abdomen without any rebound or guarding. No distention. Musculoskeletal: No lower extremity tenderness nor edema.  No joint effusions. Neurologic:  Normal speech and language. No gross focal neurologic deficits are appreciated. Skin:  Skin is warm, dry and intact. No rash noted. Psychiatric: Mood and affect are normal. Speech and behavior are normal.  ____________________________________________   LABS (all labs ordered are listed,  but only abnormal results are displayed)  Labs Reviewed  LIPASE, BLOOD - Abnormal; Notable for the following:       Result Value   Lipase 81 (*)    All other components within normal limits  COMPREHENSIVE METABOLIC PANEL - Abnormal; Notable for the following:    Potassium 3.1 (*)    Chloride 100 (*)    Glucose, Bld 127 (*)    BUN <5 (*)    AST 113 (*)    ALT 103 (*)    Total Bilirubin 1.3 (*)    All other components within normal limits  CBC - Abnormal; Notable for the following:    RBC 4.14 (*)    Platelets 82 (*)    All other components within normal limits  URINALYSIS, COMPLETE (UACMP) WITH  MICROSCOPIC - Abnormal; Notable for the following:    Color, Urine STRAW (*)    APPearance CLEAR (*)    Ketones, ur 5 (*)    Squamous Epithelial / LPF 0-5 (*)    All other components within normal limits  GASTROINTESTINAL PANEL BY PCR, STOOL (REPLACES STOOL CULTURE)  C DIFFICILE QUICK SCREEN W PCR REFLEX  TROPONIN I   ____________________________________________  EKG  ED ECG REPORT I, Doran Stabler, the attending physician, personally viewed and interpreted this ECG.   Date: 06/22/2017  EKG Time: 1932  Rate: 58  Rhythm: normal sinus rhythm with PVC 2.  Axis: Normal  Intervals:none  ST&T Change: No ST segment elevation or depression. No abnormal T-wave inversion.  ____________________________________________  RADIOLOGY  No acute finding and the CT of the abdomen and pelvis. ____________________________________________   PROCEDURES  Procedure(s) performed:   Procedures  Critical Care performed:   ____________________________________________   INITIAL IMPRESSION / ASSESSMENT AND PLAN / ED COURSE  Pertinent labs & imaging results that were available during my care of the patient were reviewed by me and considered in my medical decision making (see chart for details).   ----------------------------------------- 11:31 PM on 06/22/2017 -----------------------------------------  Patient says that he is feeling improved. Able tolerate the by mouth contrast. Reexamined his abdomen is soft and nontender. Slightly elevated lipase as well as transaminases and bilirubin. However, the patient says that he remembers these lab results that elevated his last visit. He'll be given Zofran as well as Bentyl for symptomatic relief at home. He will eat bland diet and follow-up with his physician at the [place]. We discussed imaging as well as lab results and is understandably to comply. From cytopenia appears chronic.  No diarrhea in the emergency department. Patient able to  tolerate the by mouth contrast without issue.      ____________________________________________   FINAL CLINICAL IMPRESSION(S) / ED DIAGNOSES  Pancreatitis. Thrombocytopenia. Transaminitis. Nausea. Diarrhea. Abdominal pain.    NEW MEDICATIONS STARTED DURING THIS VISIT:  New Prescriptions   No medications on file     Note:  This document was prepared using Dragon voice recognition software and may include unintentional dictation errors.     Orbie Pyo, MD 06/22/17 5051    Orbie Pyo, MD 06/22/17 [phone]

## 2019-03-14 IMAGING — CT CT ABD-PELV W/ CM
2 of 5 series · 15 of 46 positions shown, 17 images · IV contrast (APPLIED)
Comparison: MRI abdomen pelvis 11/10/2015

CLINICAL DATA: Abdominal pain. History of pancreatic and colon
cancer, status post Whipple procedure.

EXAM:
CT ABDOMEN AND PELVIS WITH CONTRAST
TECHNIQUE: Multidetector CT imaging of the abdomen and pelvis was performed
using the standard protocol following bolus administration of
intravenous contrast.
CONTRAST:  75mL P87OYE-MZZ IOPAMIDOL (P87OYE-MZZ) INJECTION 61%

[Series 2: routine abd/pel with · axial · 0.65mm/px · z∈[-492,-92]mm · 12 of 92 slices shown, 14 images]
[im 6/92  soft-tissue]
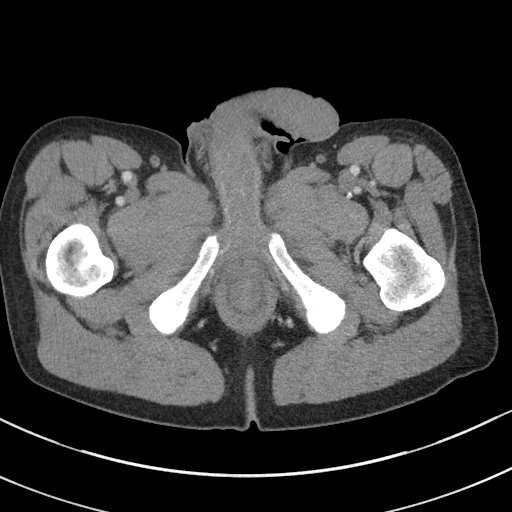
[im 6/92  bone]
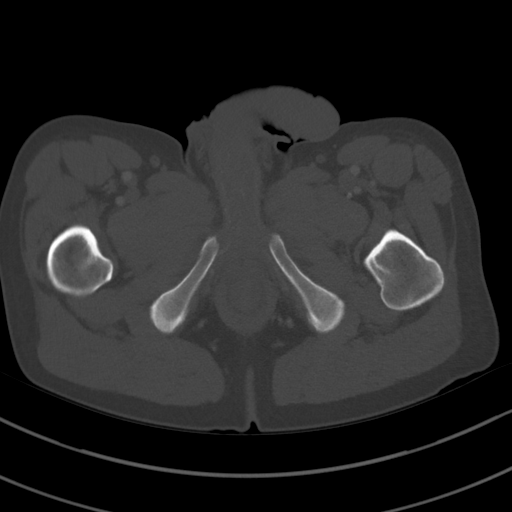
[im 16/92  soft-tissue]
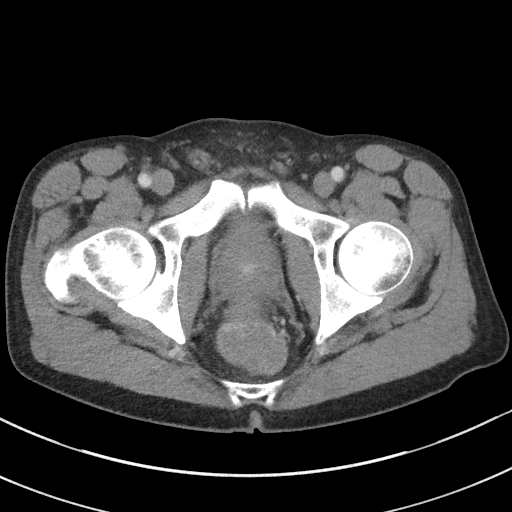
[im 21/92  soft-tissue]
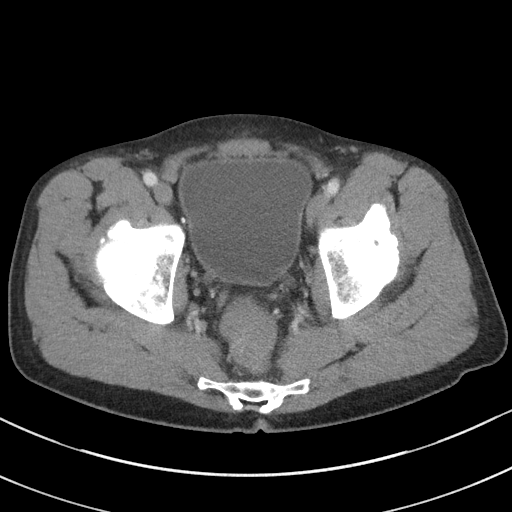
[im 26/92  soft-tissue]
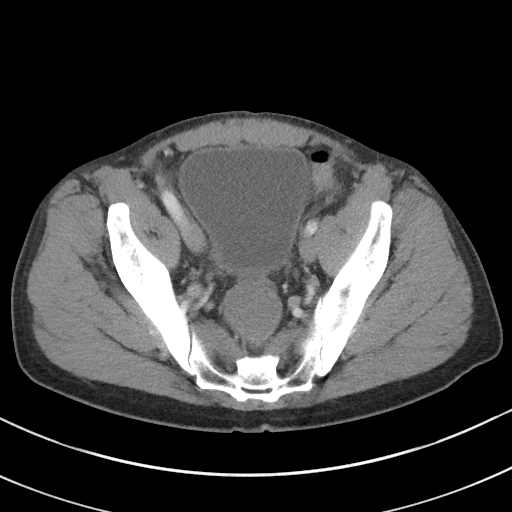
[im 36/92  soft-tissue]
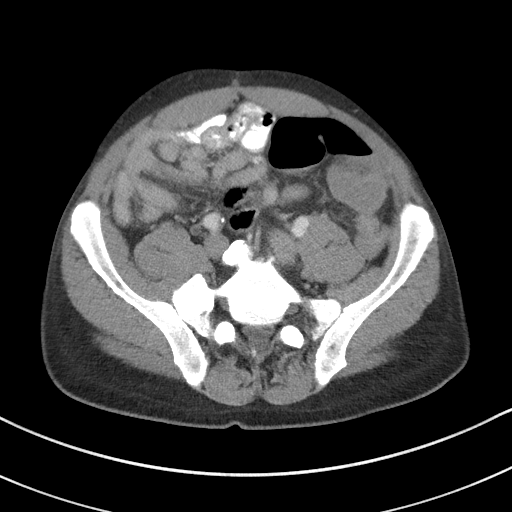
[im 41/92  soft-tissue]
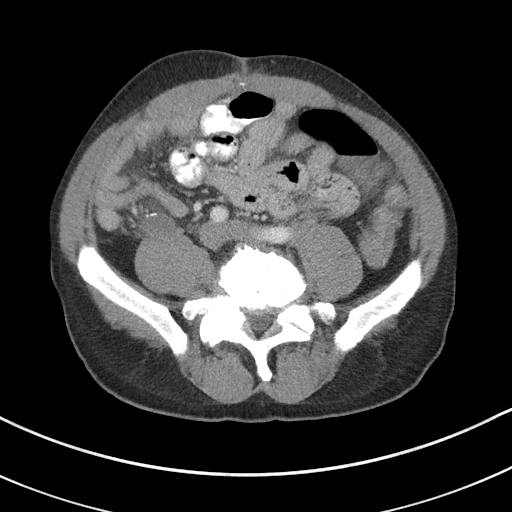
[im 51/92  soft-tissue]
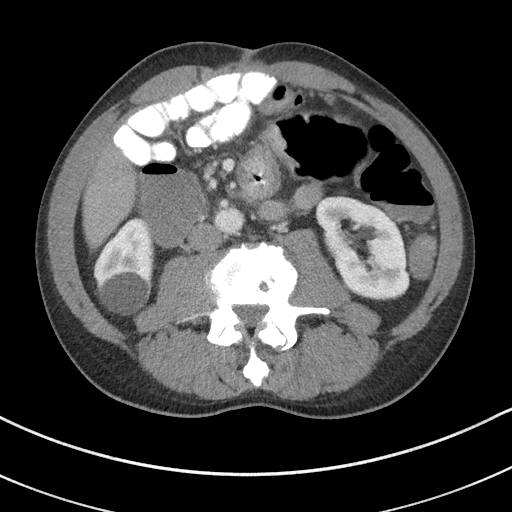
[im 56/92  soft-tissue]
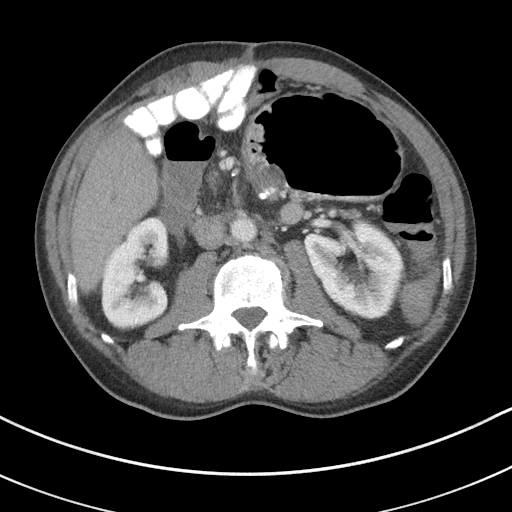
[im 66/92  soft-tissue]
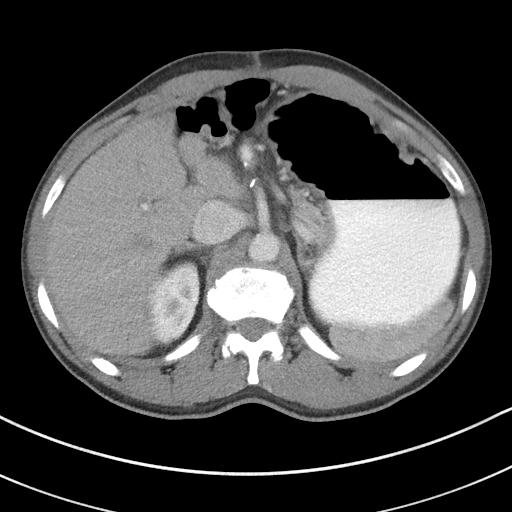
[im 66/92  bone]
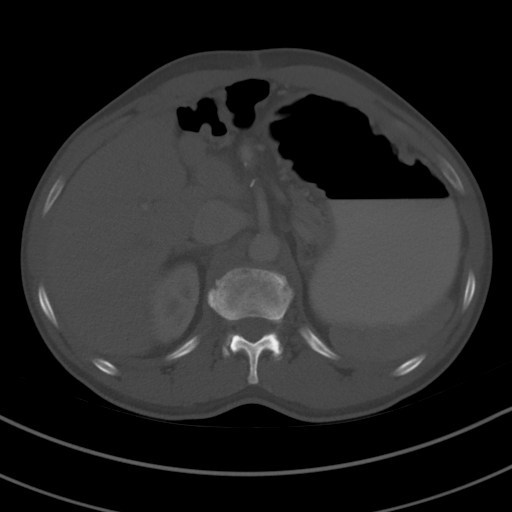
[im 71/92  soft-tissue]
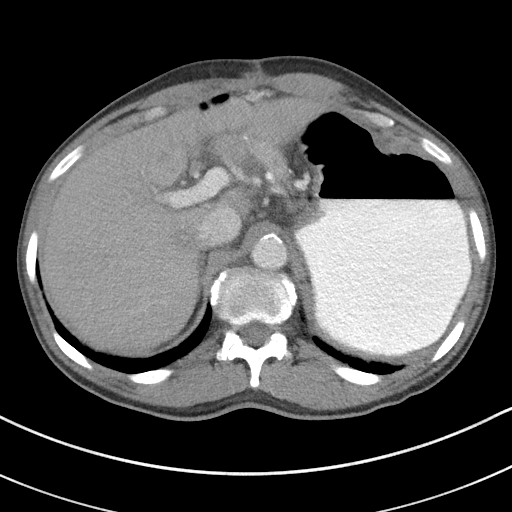
[im 76/92  soft-tissue]
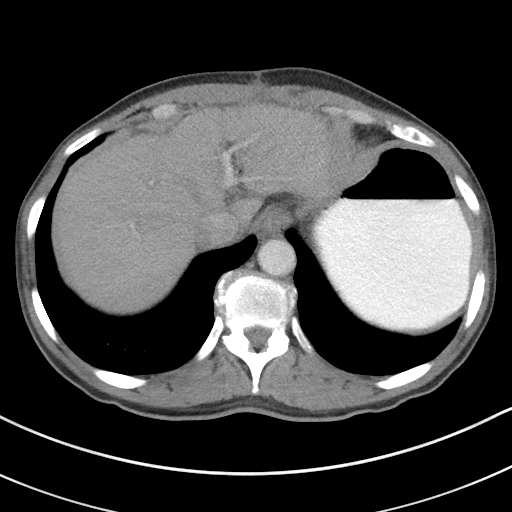
[im 86/92  soft-tissue]
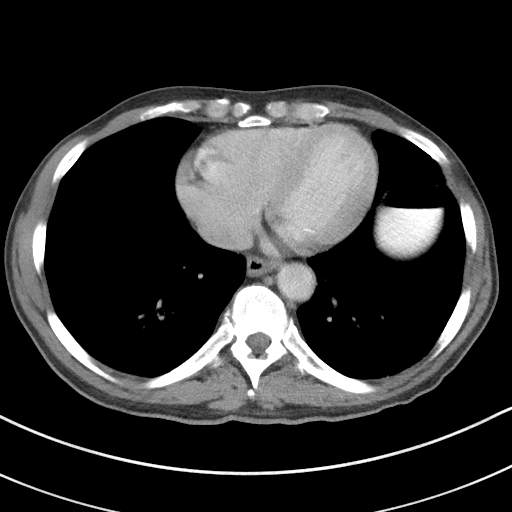

[Series 5: coronal st · coronal · 0.61mm/px · 3 of 85 slices shown]
[im 29/85  soft-tissue]
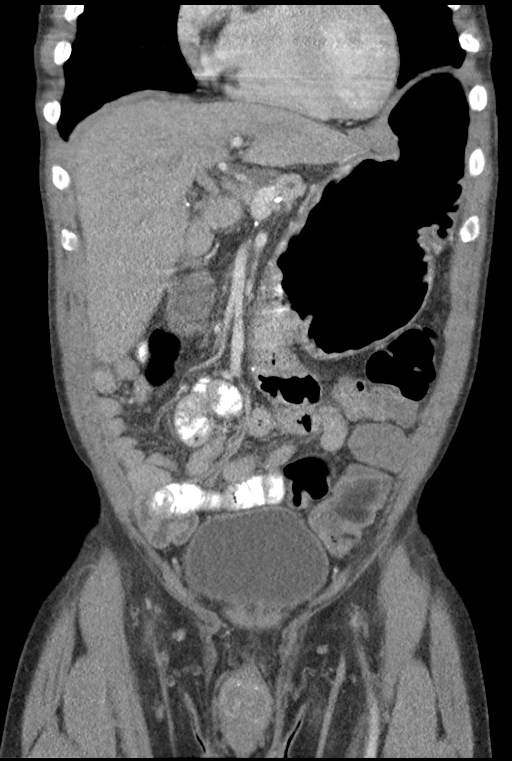
[im 38/85  soft-tissue]
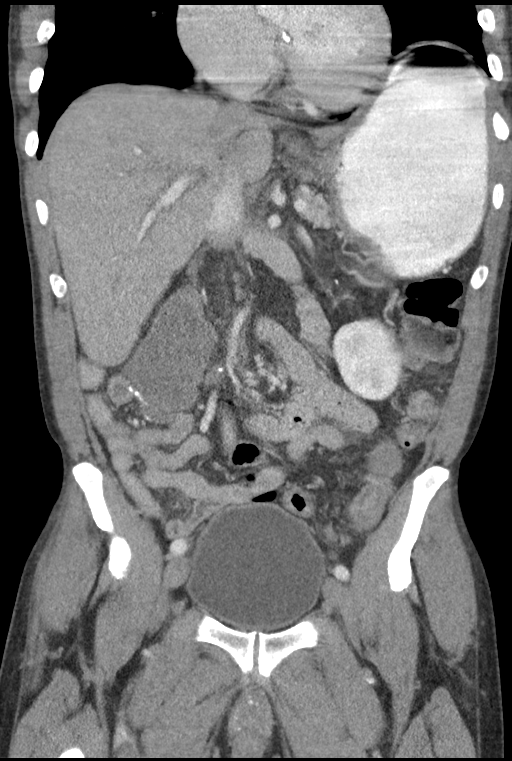
[im 47/85  soft-tissue]
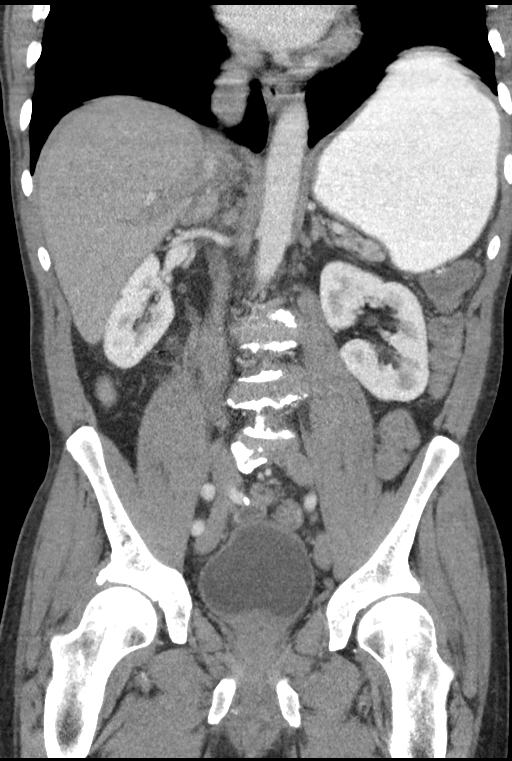

[15 of 46 positions shown; findings below may reference images not displayed]

FINDINGS: Lower chest: No pulmonary nodules or pleural effusion. No visible
pericardial effusion.

Hepatobiliary: Normal hepatic contours and density. No visible
biliary dilatation. The gallbladder is surgically absent.

Pancreas: The patient is status post Whipple procedure. There is
mild dilatation of a residual pancreatic duct. The remainder the
pancreas is normal.

Spleen: Normal.

Adrenals/Urinary Tract:

--Adrenal glands: Normal.

--Right kidney/ureter: No hydronephrosis or perinephric stranding.
No nephrolithiasis. No obstructing ureteral stones. 3.6 cm right
renal cyst.

--Left kidney/ureter: No hydronephrosis or perinephric stranding. No
nephrolithiasis. No obstructing ureteral stones.

--Urinary bladder: Unremarkable.

Stomach/Bowel:

--Stomach/Duodenum: Post Whipple findings.  No acute abnormality.

--Small bowel: No dilatation or inflammation.

--Colon: Anastomosis of the proximal colon in the right quadrant.

--Appendix: Surgically absent.

Vascular/Lymphatic: Atherosclerotic calcification is present within
the non-aneurysmal abdominal aorta, without hemodynamically
significant stenosis. There is a 9 mm portacaval lymph node. No
other abdominal or pelvic lymphadenopathy.

Reproductive: Normal prostate and seminal vesicles.

Musculoskeletal. Multilevel degenerative disc disease and facet
arthrosis. No bony spinal canal stenosis.

Other: None.
IMPRESSION: 1. No acute abdominal or pelvic lymph abnormality.
2. Findings of prior Whipple procedure and partial colectomy.
3. No evidence of abdominal or pelvic metastatic disease. No
lymphadenopathy by CT size criteria.
4.  Aortic Atherosclerosis ([id number]).

## 2021-02-27 ENCOUNTER — Other Ambulatory Visit: Payer: Self-pay

## 2021-03-06 ENCOUNTER — Other Ambulatory Visit: Payer: Self-pay

## 2021-03-19 ENCOUNTER — Other Ambulatory Visit: Payer: Medicare Other

## 2021-03-23 ENCOUNTER — Other Ambulatory Visit: Payer: Self-pay

## 2021-03-23 ENCOUNTER — Ambulatory Visit (LOCAL_COMMUNITY_HEALTH_CENTER): Payer: Self-pay

## 2021-03-23 DIAGNOSIS — Z111 Encounter for screening for respiratory tuberculosis: Secondary | ICD-10-CM

## 2021-03-26 ENCOUNTER — Ambulatory Visit (LOCAL_COMMUNITY_HEALTH_CENTER): Payer: Medicare Other

## 2021-03-26 DIAGNOSIS — Z111 Encounter for screening for respiratory tuberculosis: Secondary | ICD-10-CM

## 2021-03-26 LAB — TB SKIN TEST
Induration: 0 mm
TB Skin Test: NEGATIVE

## 2022-07-27 DIAGNOSIS — Z20822 Contact with and (suspected) exposure to covid-19: Secondary | ICD-10-CM | POA: Diagnosis not present

## 2022-11-26 ENCOUNTER — Telehealth: Payer: Self-pay | Admitting: *Deleted

## 2022-11-26 NOTE — Patient Outreach (Signed)
  Care Coordination   11/26/2022 Name: Nathan Brown MRN: [id number] DOB: [birth year]   Care Coordination Outreach Attempts:  An unsuccessful telephone outreach was attempted today to offer the patient information about available care coordination services as a benefit of their health plan.   Follow Up Plan:  Additional outreach attempts will be made to offer the patient care coordination information and services.   Encounter Outcome:  No Answer   Care Coordination Interventions:  No, not indicated    Valente David, RN, MSN, [facility] Care Management Care Management Coordinator [phone]

## 2024-02-26 ENCOUNTER — Ambulatory Visit
Admission: EM | Admit: 2024-02-26 | Discharge: 2024-02-26 | Disposition: A | Discharge status: Home / Self Care | Attending: Family Medicine | Admitting: Family Medicine

## 2024-02-26 ENCOUNTER — Emergency Department: Payer: No Typology Code available for payment source

## 2024-02-26 ENCOUNTER — Other Ambulatory Visit: Payer: Self-pay

## 2024-02-26 ENCOUNTER — Encounter: Payer: Self-pay | Admitting: Emergency Medicine

## 2024-02-26 ENCOUNTER — Emergency Department
Admission: EM | Admit: 2024-02-26 | Discharge: 2024-02-26 | Disposition: A | Discharge status: Home / Self Care | Payer: No Typology Code available for payment source | Attending: Emergency Medicine | Admitting: Emergency Medicine

## 2024-02-26 DIAGNOSIS — K7400 Hepatic fibrosis, unspecified: Secondary | ICD-10-CM | POA: Insufficient documentation

## 2024-02-26 DIAGNOSIS — Y92410 Unspecified street and highway as the place of occurrence of the external cause: Secondary | ICD-10-CM | POA: Diagnosis not present

## 2024-02-26 DIAGNOSIS — M25551 Pain in right hip: Secondary | ICD-10-CM | POA: Insufficient documentation

## 2024-02-26 DIAGNOSIS — Z713 Dietary counseling and surveillance: Secondary | ICD-10-CM | POA: Insufficient documentation

## 2024-02-26 DIAGNOSIS — S0990XA Unspecified injury of head, initial encounter: Secondary | ICD-10-CM

## 2024-02-26 DIAGNOSIS — R519 Headache, unspecified: Secondary | ICD-10-CM

## 2024-02-26 DIAGNOSIS — F32A Depression, unspecified: Secondary | ICD-10-CM | POA: Insufficient documentation

## 2024-02-26 DIAGNOSIS — M25512 Pain in left shoulder: Secondary | ICD-10-CM | POA: Insufficient documentation

## 2024-02-26 DIAGNOSIS — D696 Thrombocytopenia, unspecified: Secondary | ICD-10-CM | POA: Insufficient documentation

## 2024-02-26 DIAGNOSIS — R1031 Right lower quadrant pain: Secondary | ICD-10-CM | POA: Insufficient documentation

## 2024-02-26 DIAGNOSIS — I10 Essential (primary) hypertension: Secondary | ICD-10-CM | POA: Insufficient documentation

## 2024-02-26 DIAGNOSIS — F411 Generalized anxiety disorder: Secondary | ICD-10-CM | POA: Insufficient documentation

## 2024-02-26 DIAGNOSIS — F1210 Cannabis abuse, uncomplicated: Secondary | ICD-10-CM | POA: Insufficient documentation

## 2024-02-26 DIAGNOSIS — R1013 Epigastric pain: Secondary | ICD-10-CM | POA: Insufficient documentation

## 2024-02-26 DIAGNOSIS — J309 Allergic rhinitis, unspecified: Secondary | ICD-10-CM | POA: Insufficient documentation

## 2024-02-26 DIAGNOSIS — R42 Dizziness and giddiness: Secondary | ICD-10-CM

## 2024-02-26 DIAGNOSIS — M25519 Pain in unspecified shoulder: Secondary | ICD-10-CM | POA: Insufficient documentation

## 2024-02-26 DIAGNOSIS — B1920 Unspecified viral hepatitis C without hepatic coma: Secondary | ICD-10-CM | POA: Insufficient documentation

## 2024-02-26 DIAGNOSIS — N529 Male erectile dysfunction, unspecified: Secondary | ICD-10-CM | POA: Insufficient documentation

## 2024-02-26 DIAGNOSIS — I2510 Atherosclerotic heart disease of native coronary artery without angina pectoris: Secondary | ICD-10-CM | POA: Insufficient documentation

## 2024-02-26 DIAGNOSIS — E785 Hyperlipidemia, unspecified: Secondary | ICD-10-CM | POA: Insufficient documentation

## 2024-02-26 MED ORDER — HYDROCODONE-ACETAMINOPHEN 5-325 MG PO TABS: 1.0000 | ORAL_TABLET | Freq: Four times a day (QID) | ORAL | 0 refills | Status: AC | PRN

## 2024-02-26 MED ORDER — HYDROCODONE-ACETAMINOPHEN 5-325 MG PO TABS
1.0000 | ORAL_TABLET | Freq: Once | ORAL | Status: AC
  Administered 2024-02-26: 1 via ORAL
  Filled 2024-02-26: qty 1
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
## 2024-02-26 NOTE — Discharge Instructions (Signed)
Please go to the ER for further evaluation of your injury
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
## 2024-03-18 ENCOUNTER — Emergency Department
Admission: EM | Admit: 2024-03-18 | Discharge: 2024-03-18 | Discharge status: Against Medical Advice | Attending: Emergency Medicine | Admitting: Emergency Medicine

## 2024-03-18 ENCOUNTER — Encounter: Payer: Self-pay | Admitting: Emergency Medicine

## 2024-03-18 ENCOUNTER — Other Ambulatory Visit: Payer: Self-pay

## 2024-03-18 DIAGNOSIS — M542 Cervicalgia: Secondary | ICD-10-CM | POA: Diagnosis present

## 2024-03-18 DIAGNOSIS — Z5321 Procedure and treatment not carried out due to patient leaving prior to being seen by health care provider: Secondary | ICD-10-CM | POA: Insufficient documentation

## 2024-03-18 DIAGNOSIS — R519 Headache, unspecified: Secondary | ICD-10-CM | POA: Insufficient documentation
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
